# Patient Record
Sex: Male | Born: 1975 | Race: White | Hispanic: No | Marital: Single | Smoking: Current every day smoker
Health system: Southern US, Community
[De-identification: ages and names within clinical notes are randomized; demographics above are authoritative.]

## PROBLEM LIST (undated history)

## (undated) HISTORY — PX: HIP SURGERY: SHX245

---

## 1998-08-10 ENCOUNTER — Ambulatory Visit (HOSPITAL_COMMUNITY): Admission: RE | Admit: 1998-08-10 | Discharge: 1998-08-10 | Payer: Self-pay

## 2003-08-30 ENCOUNTER — Ambulatory Visit (HOSPITAL_COMMUNITY): Admission: RE | Admit: 2003-08-30 | Discharge: 2003-08-30 | Payer: Self-pay | Admitting: Orthopedic Surgery

## 2007-02-25 ENCOUNTER — Inpatient Hospital Stay (HOSPITAL_COMMUNITY): Admission: RE | Admit: 2007-02-25 | Discharge: 2007-02-27 | Payer: Self-pay | Admitting: Orthopaedic Surgery

## 2007-03-21 ENCOUNTER — Inpatient Hospital Stay (HOSPITAL_COMMUNITY): Admission: EM | Admit: 2007-03-21 | Discharge: 2007-03-24 | Payer: Self-pay | Admitting: Emergency Medicine

## 2007-03-21 ENCOUNTER — Ambulatory Visit: Payer: Self-pay | Admitting: Surgery

## 2007-03-28 ENCOUNTER — Ambulatory Visit: Payer: Self-pay | Admitting: Surgery

## 2007-04-08 ENCOUNTER — Encounter: Admission: RE | Admit: 2007-04-08 | Discharge: 2007-04-08 | Payer: Self-pay | Admitting: Surgery

## 2007-04-08 ENCOUNTER — Ambulatory Visit: Payer: Self-pay | Admitting: Surgery

## 2008-11-16 IMAGING — CR DG CHEST 2V
1 series · 1 of 1 positions shown · non-contrast
Comparison: None.

CLINICAL DATA: Hip replacement. 
 CHEST - 2 VIEW:

[w chest lat]
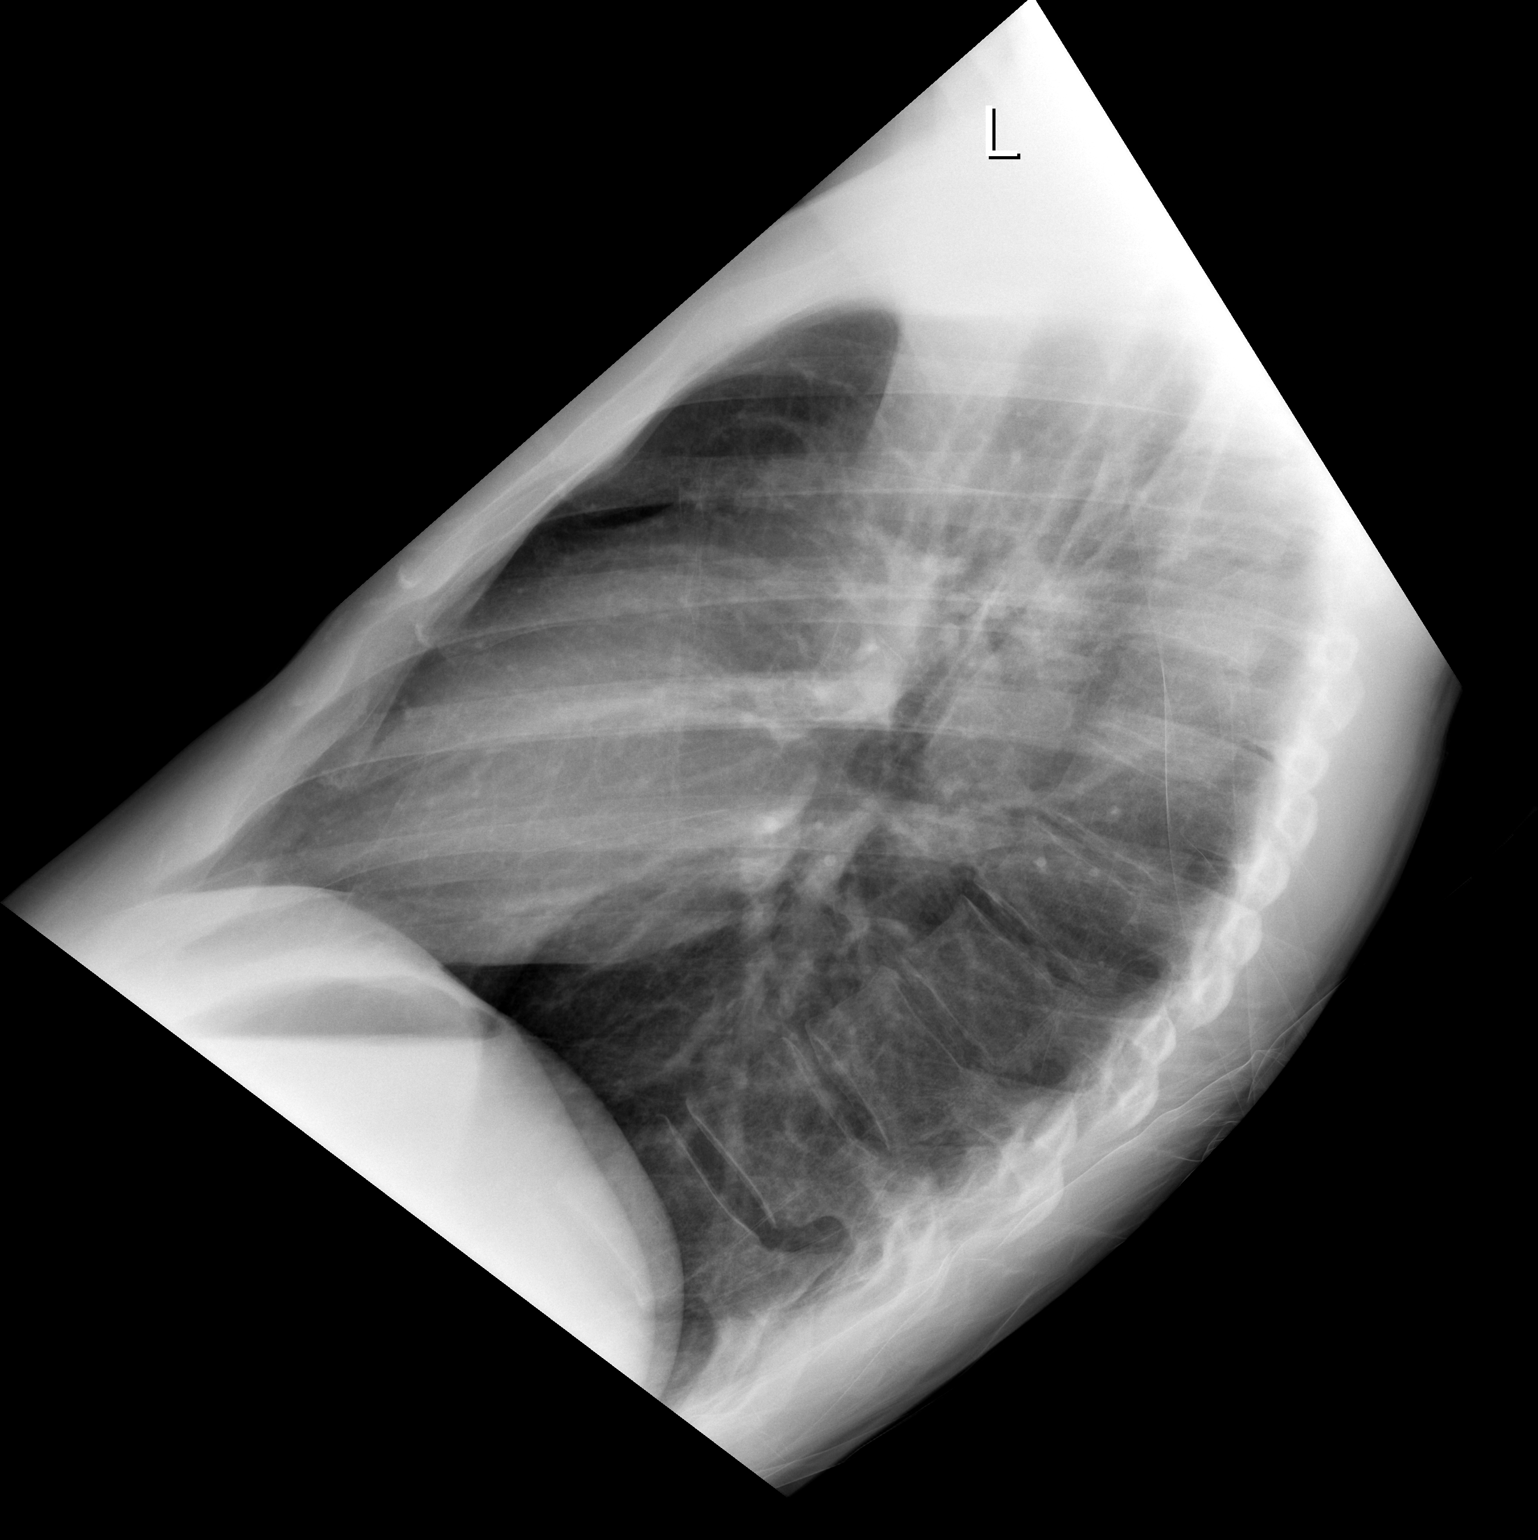

[1 of 1 positions shown; findings below may reference images not displayed]

FINDINGS: Heart and mediastinum are normal.  The lungs are clear.  No effusions.  Bony structures are unremarkable.
IMPRESSION: Normal chest.

## 2008-12-08 IMAGING — CR DG CHEST 2V
2 series · 2 of 2 positions shown · non-contrast
Comparison: 02/26/07.

CLINICAL DATA: 31 year-old-male with chest pain.   History of hip surgery. 
 CHEST - 2 VIEW ? 03/20/07:

[w chest pa]
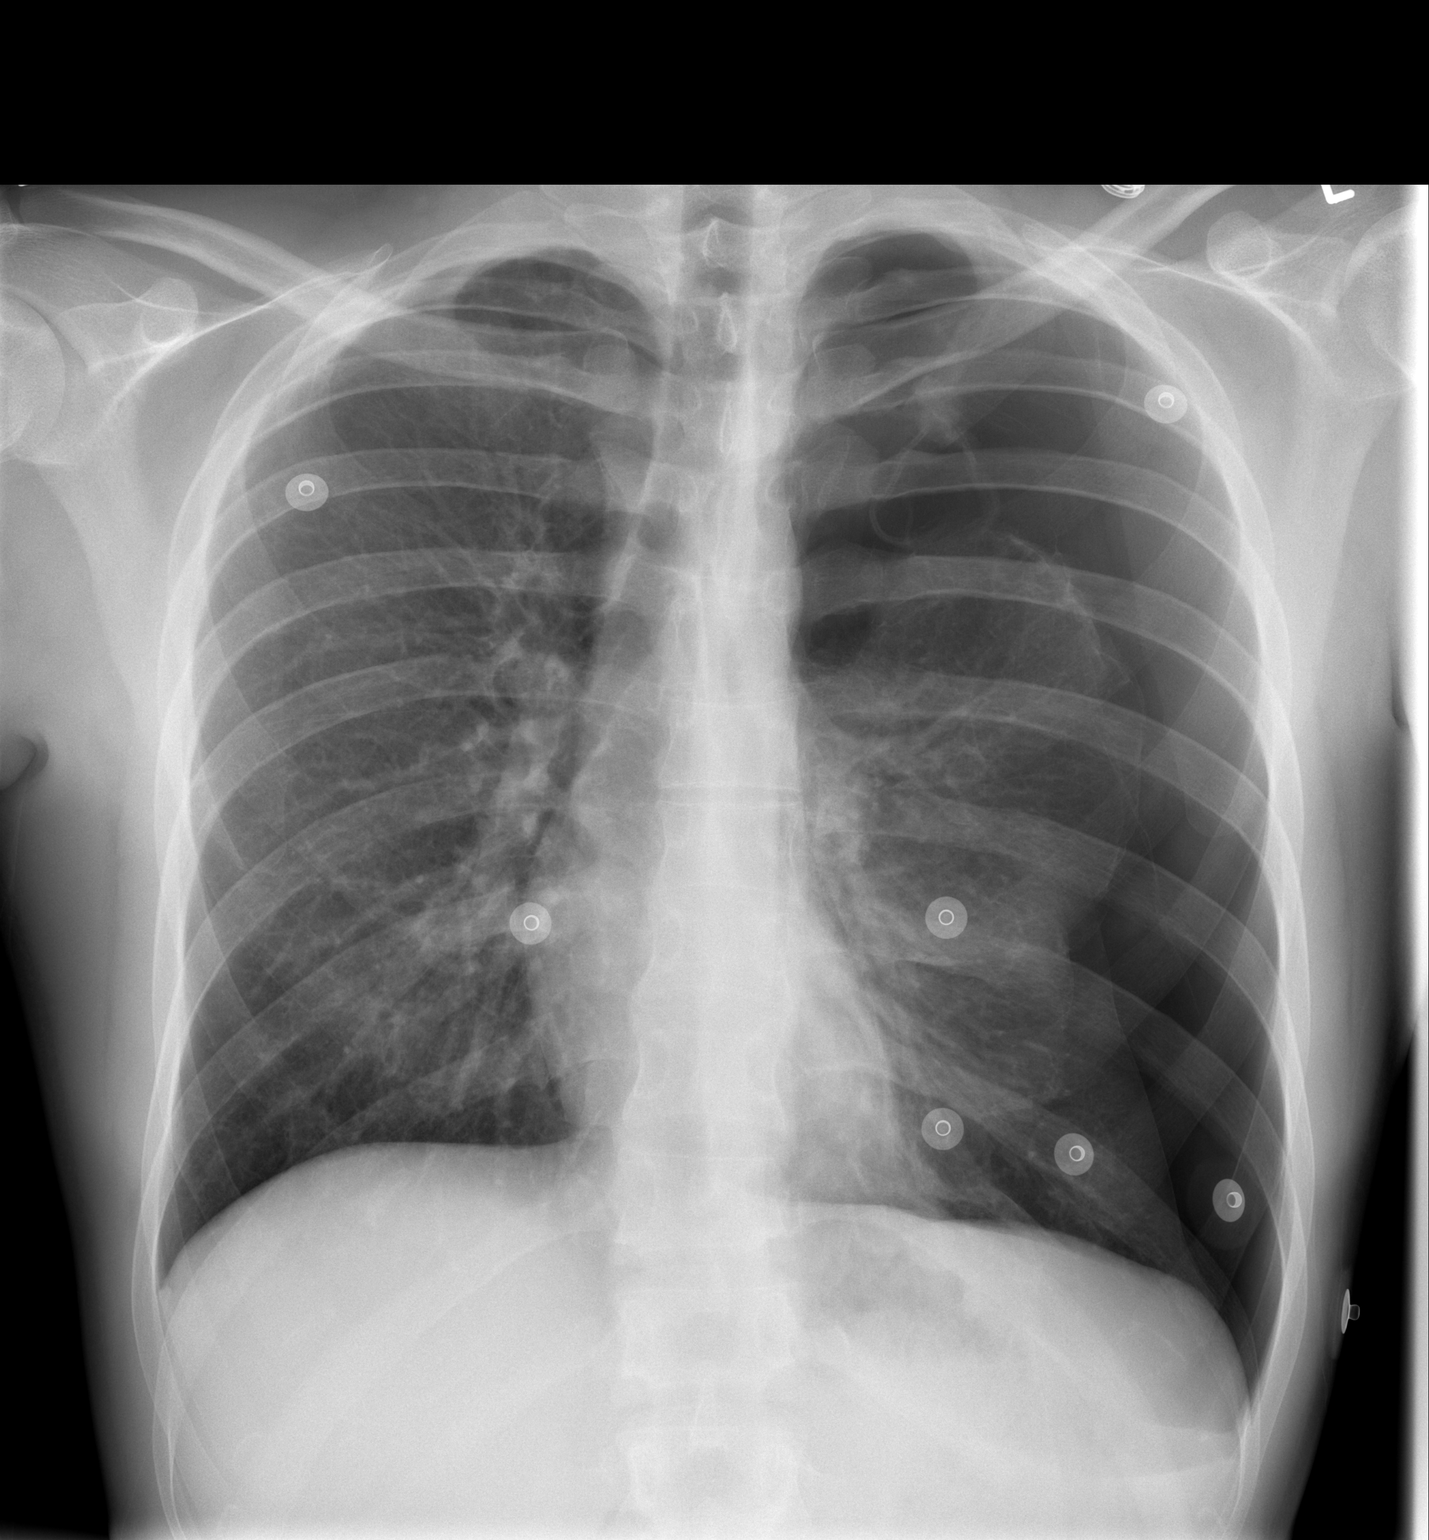

[w chest lat]
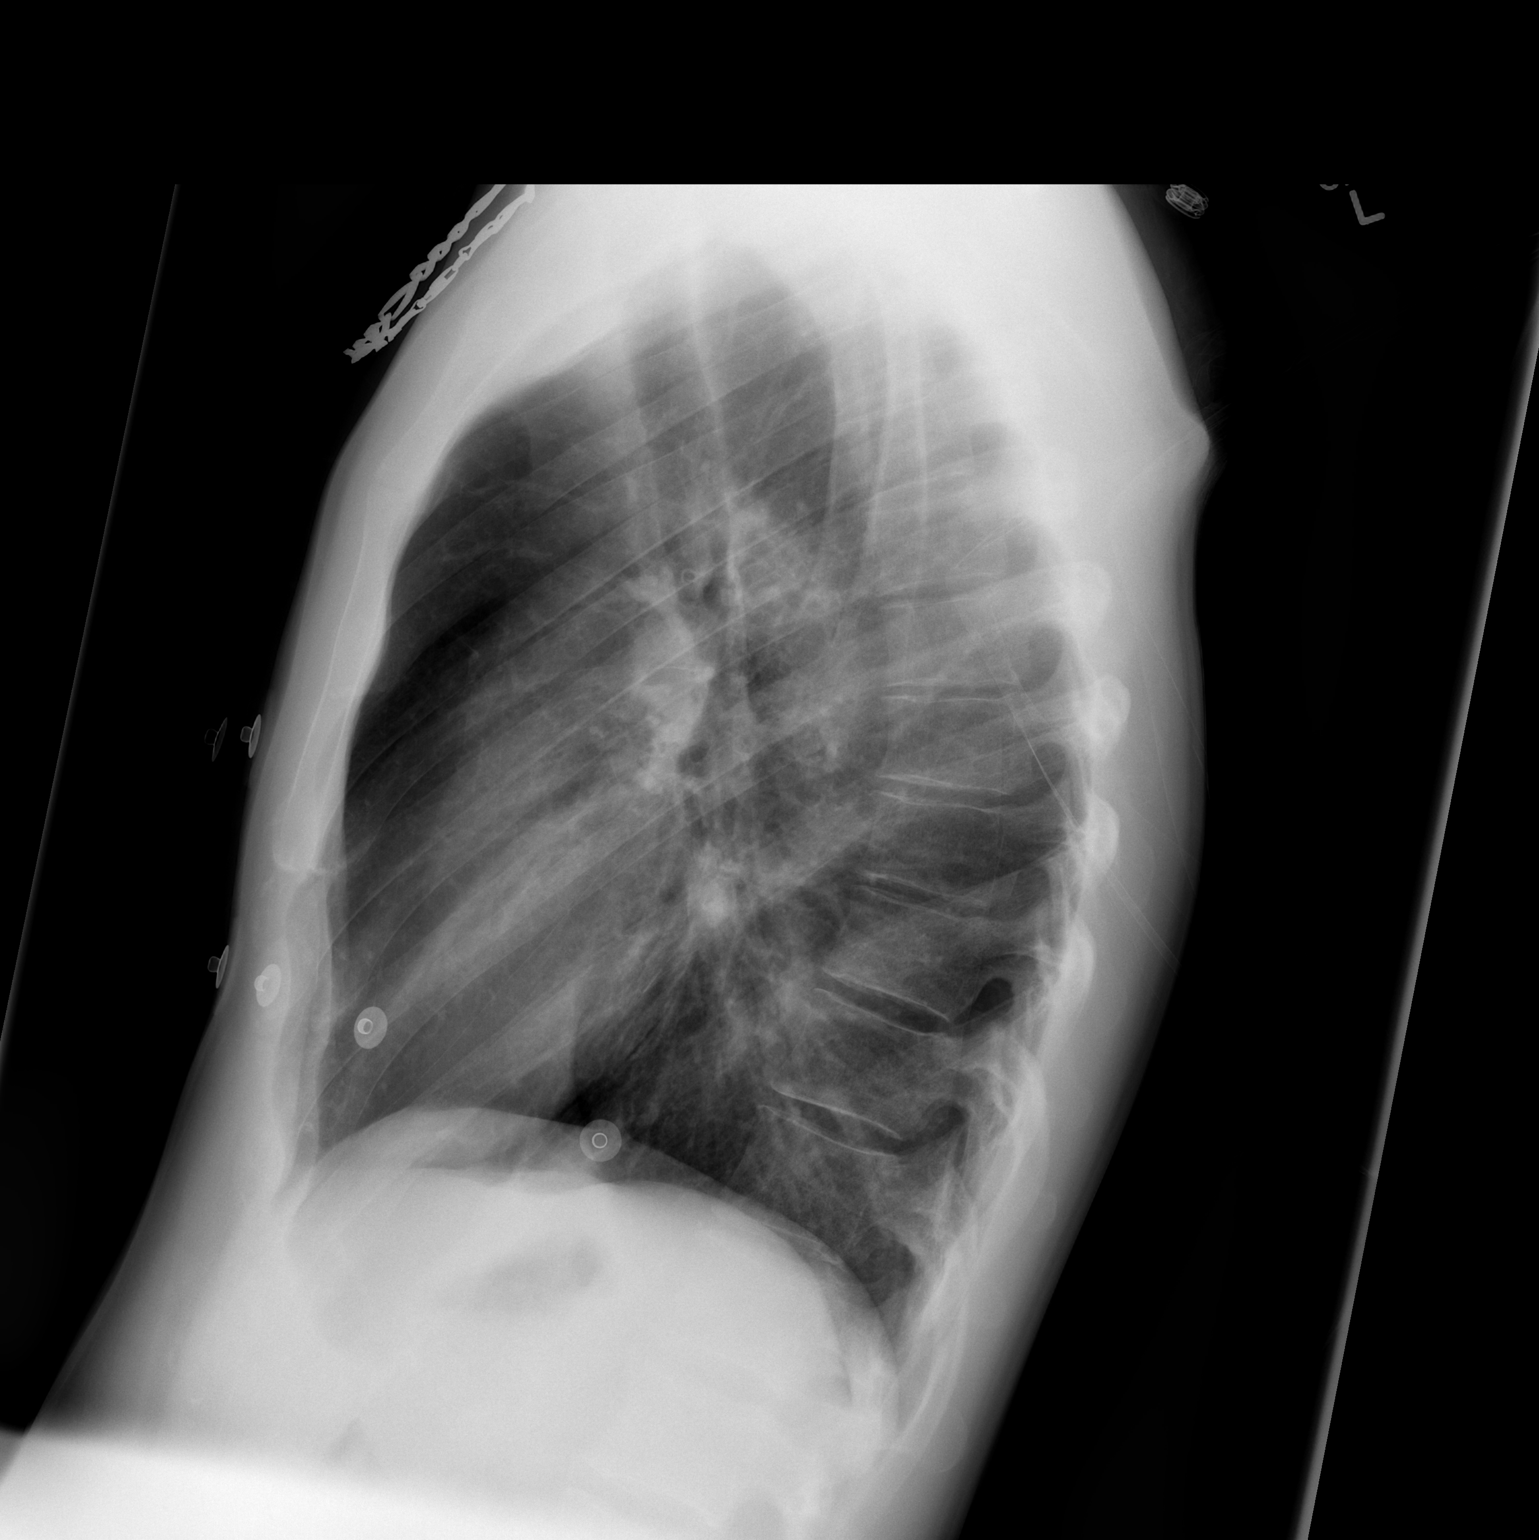

[2 of 2 positions shown; findings below may reference images not displayed]

FINDINGS: Two views of the chest demonstrate a large left pneumothorax measuring at least 50%.  The trachea remains midline without definite mediastinal shift.  The right lung is clear.   Bone structures are intact without a displaced fracture.
IMPRESSION: Large left pneumothorax measuring at least 50%.

## 2008-12-11 IMAGING — CR DG CHEST 2V
2 series · 2 of 2 positions shown · non-contrast
Comparison: Chest radiograph, 03/21/07 and 03/22/07.

CLINICAL DATA: Pneumothorax.  Status post chest tube placement.  Tachycardia.
 CHEST - 2 VIEW - 03/23/07:

[w chest pa]
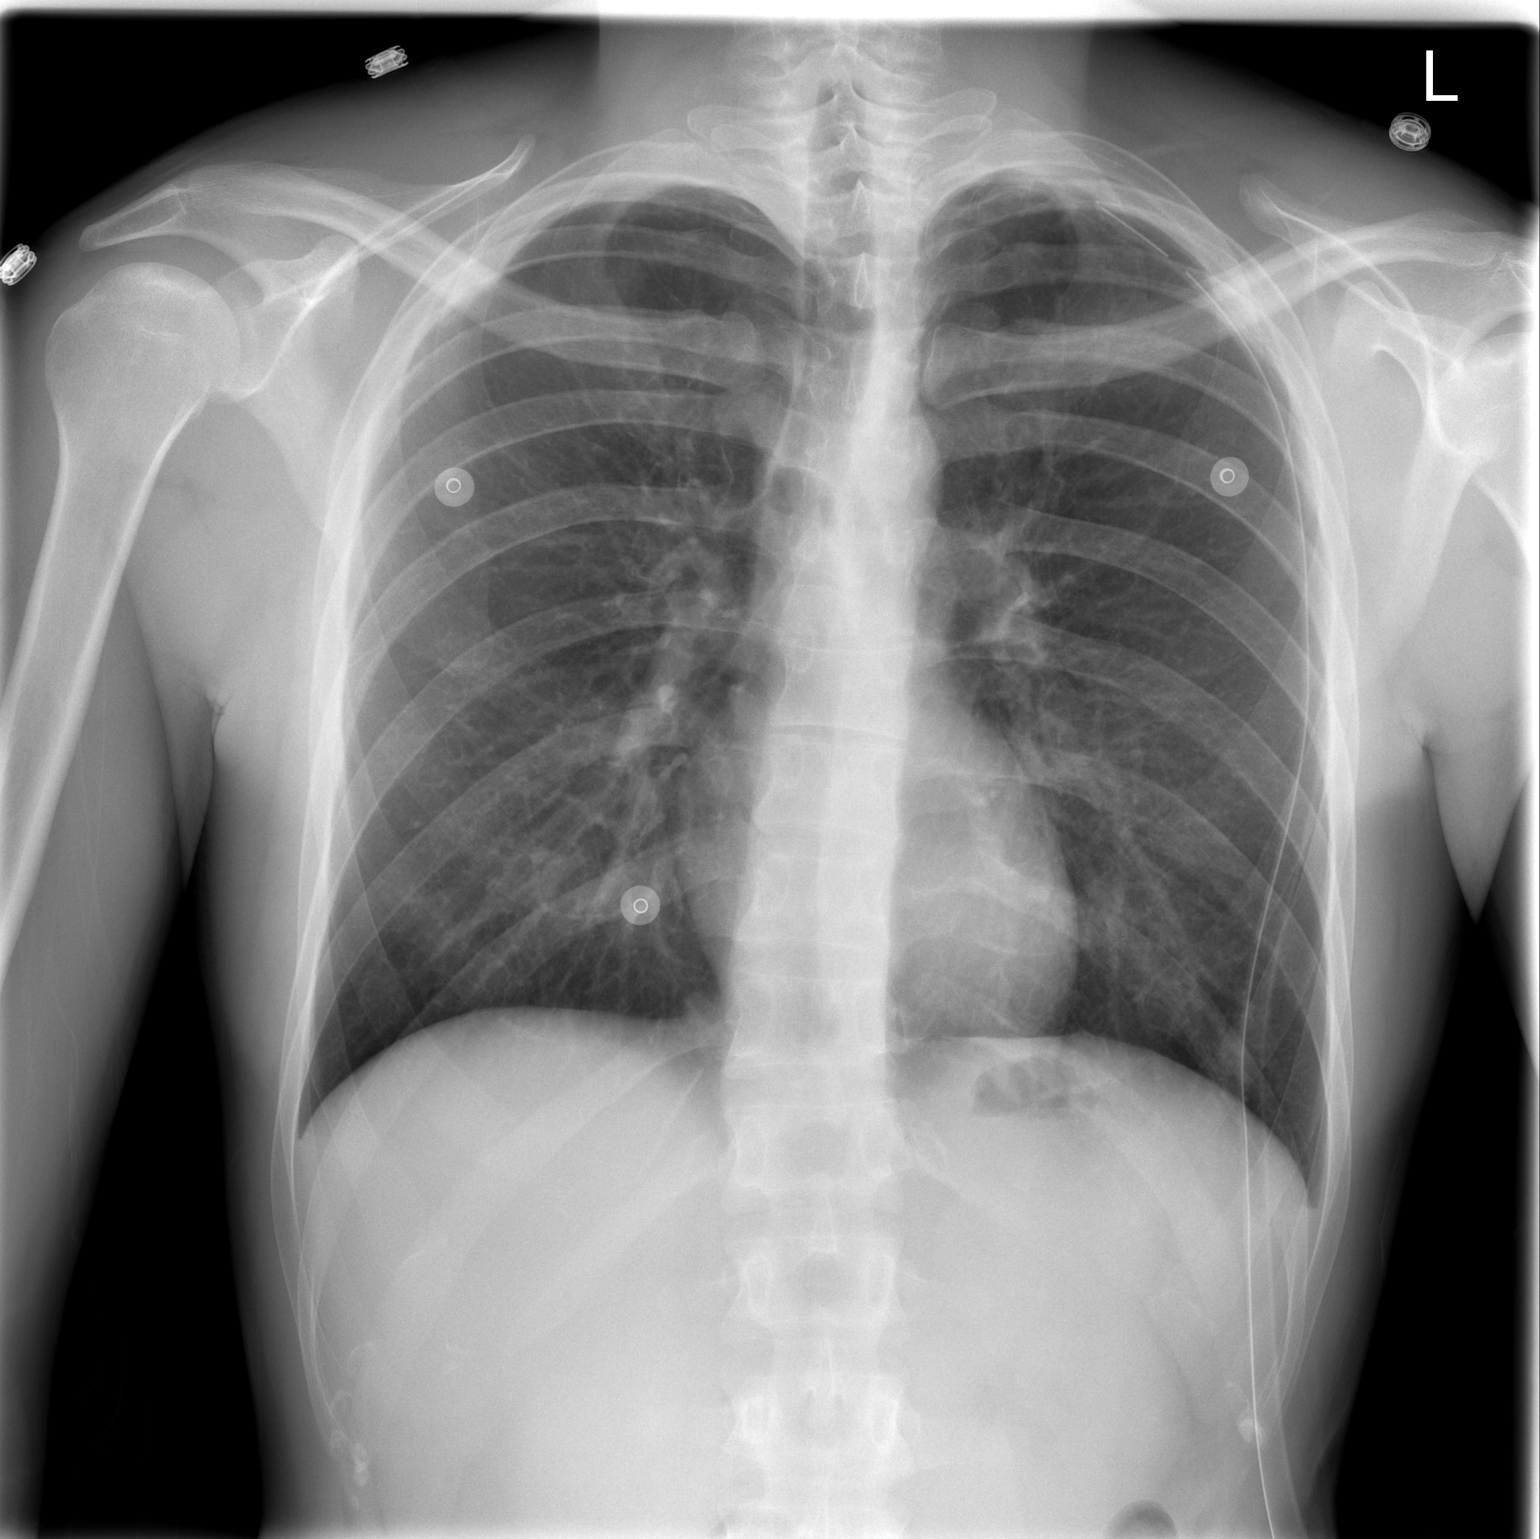

[w chest lat]
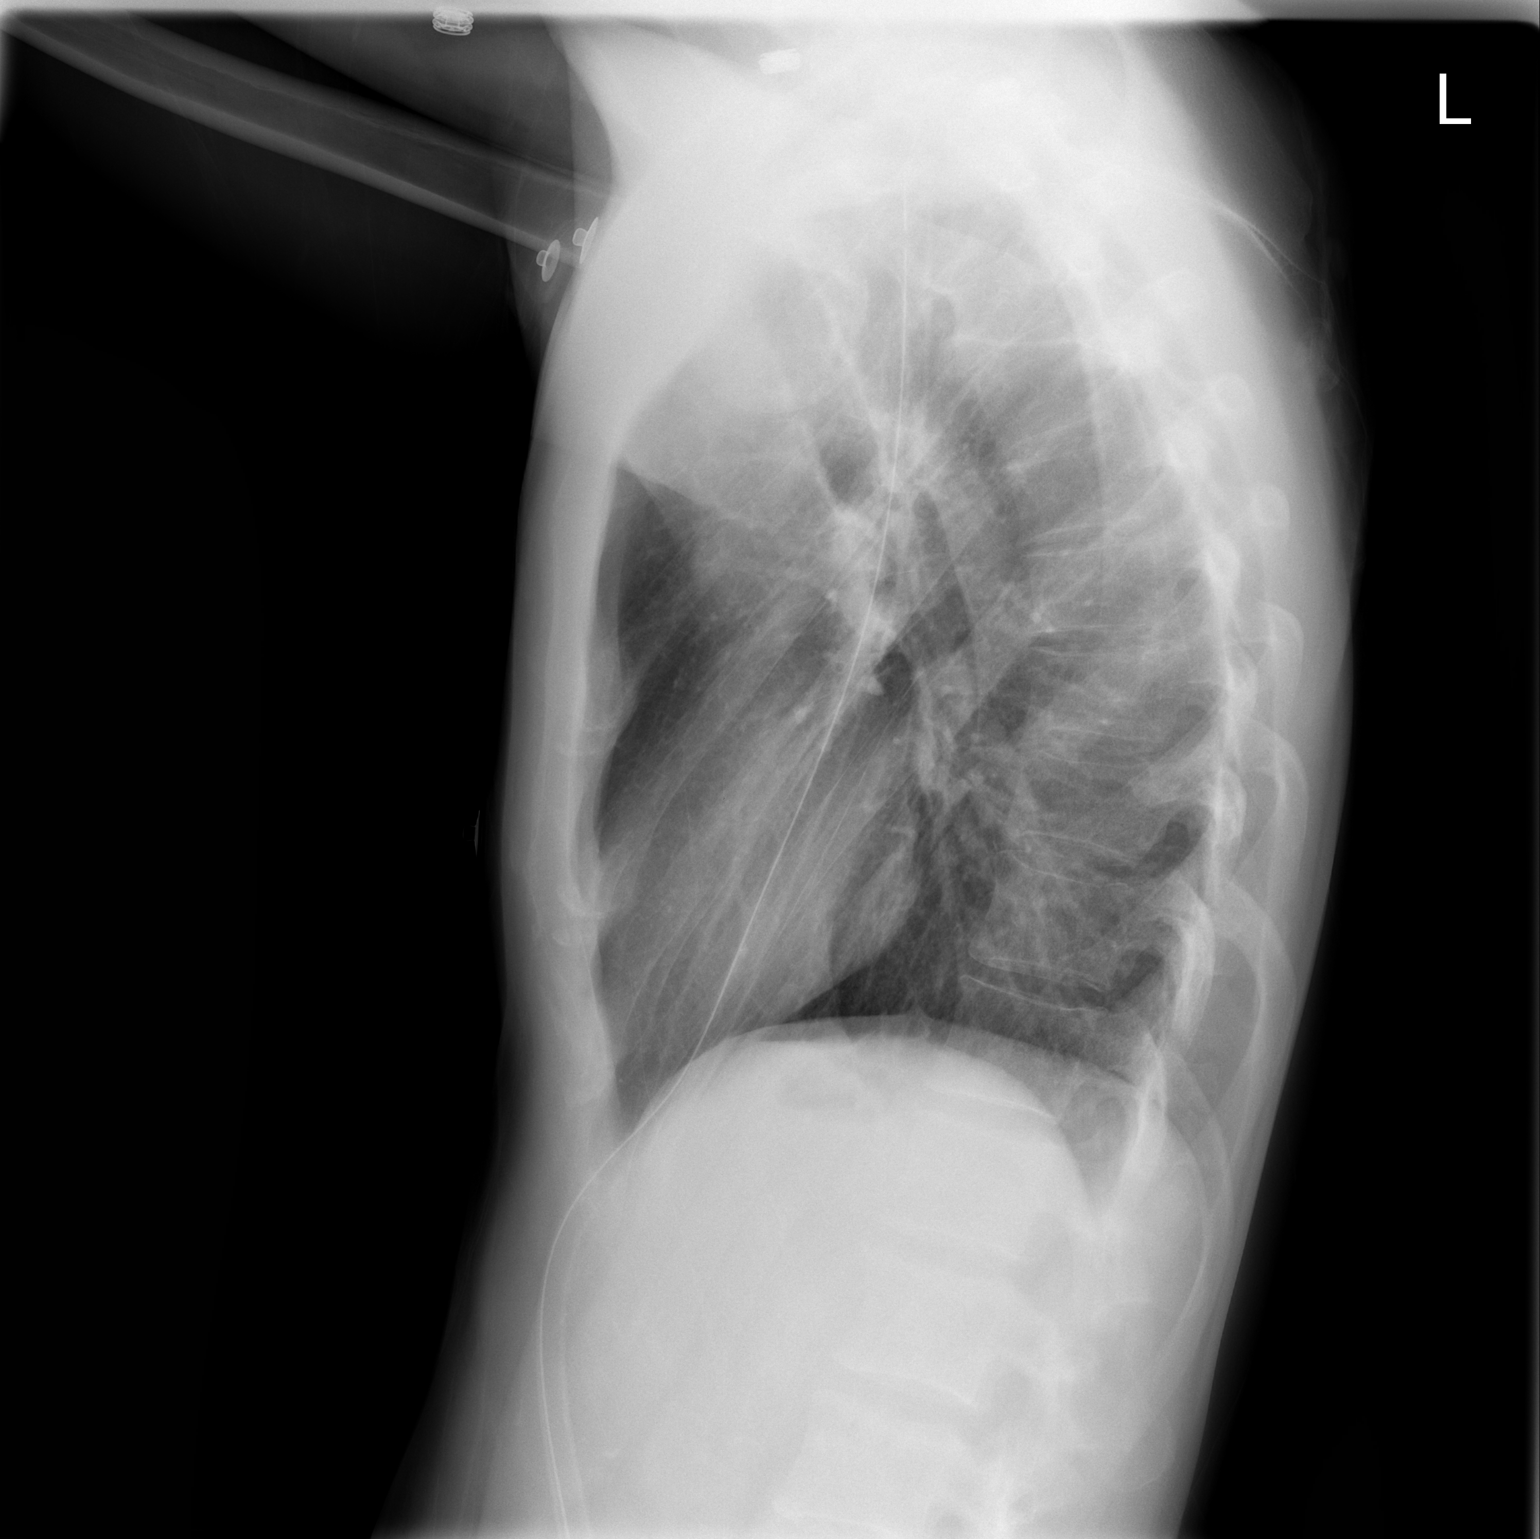

[2 of 2 positions shown; findings below may reference images not displayed]

FINDINGS: A left chest tube is in place with the tip at the left lung apex.  No pneumothorax is identified.  The heart and mediastinal contours are normal.  The lungs are clear.   Negative for effusion.  No acute bony abnormality.
IMPRESSION: 1.  Left chest tube in place. 
 2.  No pneumothorax identified.

## 2010-07-25 NOTE — Assessment & Plan Note (Signed)
OFFICE VISIT   Eastwood, Douglas Short  DOB:  18-Jun-1975                                        April 08, 2007  CHART #:  [id number]   The patient returned today for followup of his spontaneous left  pneumothorax status post chest tube insertion on 03/20/2007.  His air  leak rapidly resolved and the chest tube was removed successfully.  Since discharge, he has been feeling well overall.  He has minimal  discomfort at the chest tube site.   PHYSICAL EXAMINATION:  Today, blood pressure is 133/89.  Pulse 119 and  regular.  Saturation 92% on room air.  He looks well.  Lung exam is  clear.   Followup chest x-ray today shows no pneumothorax or effusion.   IMPRESSION:  The patient is doing well with no sign of recurrent  pneumothorax.  I strongly advised him to completely abstain from  smoking.  I told him he could return to his normal activity as  tolerated.  I told him that there is about 20% chance that this  happening again over the next 2 years and he will contact us if he  develops any chest pain or shortness of breath similar to what he had  before.   Evelene Croon, M.D.  Electronically Signed   BB/MEDQ  D:  04/08/2007  Short:  04/08/2007  Job:  [id number]

## 2010-07-25 NOTE — H&P (Signed)
NAME:  Douglas Short, Douglas Short                ACCOUNT NO.:  [id number]   MEDICAL RECORD NO.:  [id number]          PATIENT TYPE:  INP   LOCATION:  3311                         FACILITY:  [facility]   PHYSICIAN:  Evelene Croon, M.D.     DATE OF BIRTH:  02-Mar-1976   DATE OF ADMISSION:  03/20/2007  DATE OF DISCHARGE:                              HISTORY & PHYSICAL   REASON FOR ADMISSION:  Spontaneous left pneumothorax.   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  He was at home recovering and has been walking with  a walker.  Tonight about 8:30 he had just finished his first walk using  crutches and developed pain under his left arm and then his left chest  associated with shortness of breath.  This pain moved around to  different areas of his left chest and shoulder.  He presented to the  emergency room and a chest x-ray showed greater than 50% left  spontaneous pneumothorax.  The patient remained hemodynamically stable,  developed tachycardia in the 130s, and required 2 liters of oxygen to  maintain sats of 95%.  I was asked to see the patient in the emergency  room.   PAST MEDICAL HISTORY:  Significant for recent right total hip  replacement for avascular necrosis by Dr. Marcene Corning.  He has no  other surgical or medical illnesses.   REVIEW OF SYSTEMS:  GENERAL:  He denies any fever, chills.  She had no  recent weight changes.  He denies fatigue.  EYES: Negative.  ENT:  Negative.  ENDOCRINE:  Denies diabetes and hypothyroidism.  CARDIOVASCULAR:  He denies any previous chest pain except for that  developed this evening.  He denies any previous shortness of breath.  He  has had no PND, orthopnea and denies peripheral edema, palpitations.  RESPIRATORY: Denies cough and sputum production.  GI: Has had no nausea,  vomiting.  Denies any melena or bright red blood per rectum.  GU:  Denies  dysuria and hematuria.  MUSCULOSKELETAL:  Denies arthralgias and  myalgias.  He is status post right total hip replacement recently.  VASCULAR:  Denies claudication and phlebitis.  He has been taking  Coumadin for DVT prevention after surgery.  A Home Health nurse has been  checking INR couple times a week.  NEUROLOGICAL:  Denies any focal  weakness or numbness.  Denies dizziness and syncope.  No TIA or stroke.   ALLERGIES:  None.   SOCIAL HISTORY:  He smokes one to one and a half packs of cigarettes per  day.  He drinks some alcohol.  He does smoke marijuana.  He works in a  Systems analyst.  He is single and here with his parents.   FAMILY HISTORY:  Negative.   PHYSICAL EXAMINATION:  VITAL SIGNS:  Blood pressure is 140/87, pulse 136  and regular in sinus tachycardia.  Respiratory rate is 24 and unlabored.  He is afebrile.  Sat is 95% on 2 liters.  He is  a thin, well-developed  white male in no distress.  HEENT:  Normocephalic and atraumatic.  Pupils are equal, reactive to  light and accommodation.  Extraocular muscles are intact.  Throat is  clear.  NECK:  Normal carotid pulses bilaterally.  There are no bruits.  There  is no adenopathy or thyromegaly.  CARDIAC:  Regular rate and rhythm with normal S1-S2.  There is no  murmur, rub or gallop.  LUNGS:  Revealed decreased breath sounds on the left.  ABDOMEN:  Active bowel sounds.  Abdomen is soft and nontender.  No  palpable masses or organomegaly.  EXTREMITY:  No peripheral edema.  Pedal pulses bilaterally.  Skin is  warm and dry.  NEUROLOGIC:  Alert and oriented x3.  Motor and sensory exams grossly  normal.   IMPRESSION:  Mr. Kall has a first episode of spontaneous left  pneumothorax.  We will plan to insert a chest tube.  I discussed the  pathologic process with he and his parents.  We discussed alternatives  for treatment including chest tube and surgery.  I do not think he is a  candidate for observation giving the degree  of pneumothorax.  I  discussed the benefits and risk of the chest tube insertion including  but not limited to bleeding, infection, failure to resolve the  spontaneous pneumothorax, persistent air leak requiring surgery, and  recurrence of pneumothorax even if it is successfully treated initially.  He understands all this and agrees to proceed.      Evelene Croon, M.D.  Electronically Signed     BB/MEDQ  D:  03/21/2007  T:  03/21/2007  Job:  [id number]

## 2010-07-25 NOTE — Op Note (Signed)
NAME:  Douglas Short, Douglas Short                ACCOUNT NO.:  [id number]   MEDICAL RECORD NO.:  [id number]          PATIENT TYPE:  INP   LOCATION:  3311                         FACILITY:  [facility]   PHYSICIAN:  Evelene Croon, M.D.     DATE OF BIRTH:  [birth year]   DATE OF PROCEDURE:  03/21/2007  DATE OF DISCHARGE:                               OPERATIVE REPORT   PREOPERATIVE AND POSTOPERATIVE DIAGNOSIS:  Large spontaneous left  pneumothorax.   PROCEDURE:  Insertion of left tube.   SURGEON:  Dr. Evelene Croon.   ANESTHESIA:  1% lidocaine local anesthesia.   CLINICAL HISTORY:  The patient is a 35 year old smoker who presented  with his first episode of spontaneous left pneumothorax.  The chest x-  ray showed a greater than 50% left spontaneous pneumothorax.  He is  tachycardic and short of breath.  I discussed the options for treatment  including proceeding to thoracoscopic surgery as well as chest tube  insertion which is the treatment I recommended.  I discussed the  alternatives, benefits, and risks including bleeding, injury to the  lung, infection, prolonged air leak requiring eventual surgery,  inability to re-expand the lung with the chest tube, and recurrence of  pneumothorax even though initially treated successfully.  He understood  all this and agreed to proceed.   PROCEDURE:  This was performed in the emergency department on a  stretcher.  After informed consent was obtained and the patient was  given 2 mg of intravenous Versed, then the left chest was prepped with  Betadine solution and draped in usual sterile manner.  The skin and  subcutaneous tissue in the left anterior axillary line was anesthetized  with 1% lidocaine local anesthesia.  A small incision was made and  carried down through the subcutaneous tissues using blunt dissection.  The left pleural space was entered bluntly.  There is a large rush of  air.  Then a 20-French trocar chest tube was inserted and advanced so  the  tip of the tube was near the apex of the chest.  The trocar was  removed.  The chest tube was sutured to the skin with a 2-0 silk suture  and connected to Pleur-Evac suction.  Dry sterile dressing applied  around the tube.  There is complete hemostasis.  The patient tolerated  procedure well.  Follow-up chest x-ray showed complete re-expansion of  the left lung with tube in good position.      Evelene Croon, M.D.  Electronically Signed     BB/MEDQ  D:  03/21/2007  T:  03/21/2007  Job:  [id number]

## 2010-07-25 NOTE — Discharge Summary (Signed)
NAME:  Douglas Short, Douglas Short                ACCOUNT NO.:  [id number]   MEDICAL RECORD NO.:  [id number]          PATIENT TYPE:  INP   LOCATION:  2030                         FACILITY:  [facility]   PHYSICIAN:  Evelene Croon, M.D.     DATE OF BIRTH:  02/18/1976   DATE OF ADMISSION:  03/20/2007  DATE OF DISCHARGE:                               DISCHARGE SUMMARY   FINAL DIAGNOSIS:  Spontaneous left pneumothorax.   SECONDARY DIAGNOSES:  Status post right total  hip replacement for  septic necrosis by Dr. [name].   IN HOSPITAL OPERATIONS AND PROCEDURES:  Placement of a left 20-French  chest tube.   HISTORY AND PHYSICAL AND HOSPITAL COURSE:  The patient is a 35 year old  white male smoker status post recent right THR for septic necrosis by  Dr. [name] who develop sudden pain in his left chest on March 21, 2007 after walking around with crutches for the first time since  surgery.  His pain started under the left shoulder and moved around with  associated shortness of breath.  The patient came into the ER at that  point.  Chest x-ray done showed greater than 50% left spontaneous  pneumothorax.  Dr. Laneta Simmers was contacted at this time.  Dr. Laneta Simmers  discussed with the patient placement of a left chest tube for  spontaneous left pneumothorax.  He discussed risks and benefits with the  patient.  The patient acknowledged understanding and agreed to proceed.  A 20 French left chest tube was placed while the patient was in the  emergency department.   PAST MEDICAL HISTORY/PHYSICAL EXAMINATION:  For details, please see  dictated H&P.   The patient was admitted to [facility] on March 20, 2007 with  diagnosis of spontaneous left pneumothorax status post placement of left  chest tube.  Chest x-ray obtained post placement of chest tube showed  resolved pneumothorax.  The patient was placed to suction.  During the  patient's postoperative course, no air leak was noted from the patient's  Pleur-evac.   Chest x-ray remained stable with no pneumothorax.  The  patient's chest tube was placed to waterseal on March 22, 2007.  Chest  x-ray remained stable with no pneumothorax, and chest tube was DC.  Follow-up chest x-ray remained stable with no pneumothorax.  During this  time, the patient's vital signs were monitored.  The patient remained  afebrile.  He was able to be weaned off oxygen, satting greater than 90%  on room air.  He remained in normal sinus rhythm.  Pulmonary status was  stable and continued to use his incentive spirometer.  The chest tube  site was clean, dry and intact post removal of chest tube.  The patient  was restarted on his Coumadin.  Daily PT INRs were obtained.  Last INR  obtained March 23, 2007 was 1.9.  The patient was out of bed  ambulating well post admission.  He is tolerating diet well.  No nausea  or vomiting noted.   The patient is tentatively ready for discharge home in the a.m. pending  his PA and  lateral chest x-ray remain stable.   The patient will need to follow up with Dr. Laneta Simmers in 3 weeks.  Our  office will contact the patient with this information.  The patient will  need to obtain PA and lateral chest x-ray 30 minutes prior to this  appointment.  The patient will need to follow up with Dr. [name] as  directed.  He will need obtain PT/INR per Dr. [name] as directed.   ACTIVITY:  The patient instructed no driving to released to do so, no  heavy lifting over 10 pounds.  He is told to ambulate 3-4 times per day  and progress as tolerated.  Continue his breathing exercises.   INCISIONAL CARE:  The patient is told __________ shower from __________  .  He is to wash his incisions using soap and water.  He is to contact  the office if he develops any drainage or opening from any of his  incision sites.   DIET:  The patient's diet is to be low fat, low salt.  Patient  instructed to stop smoking.   DISCHARGE MEDICATIONS:  1. Coumadin will be  dosed based on the patient's discharge PT/INR      level.  2. Percocet 5/325, 1-2 tablets q.4-6h. p.r.n. pain.      Theda Belfast, [place]      Evelene Croon, M.D.  Electronically Signed    KMD/MEDQ  D:  03/23/2007  T:  03/23/2007  Job:  [id number]   cc:   Lubertha Basque. [name], M.D.

## 2010-07-25 NOTE — Op Note (Signed)
NAME:  Douglas Short, Douglas Short                ACCOUNT NO.:  [id number]   MEDICAL RECORD NO.:  [id number]          PATIENT TYPE:  INP   LOCATION:  2899                         FACILITY:  [facility]   PHYSICIAN:  Lubertha Basque. Dalldorf, M.D.DATE OF BIRTH:  1975-09-02   DATE OF PROCEDURE:  02/25/2007  DATE OF DISCHARGE:                               OPERATIVE REPORT   PREOPERATIVE DIAGNOSIS:  Right hip avascular necrosis.   POSTOPERATIVE DIAGNOSIS:  Right hip avascular necrosis.   PROCEDURE:  Right total hip replacement.   ANESTHESIA:  General.   ATTENDING SURGEON:  Lubertha Basque. [name], M.D.   ASSISTANT:  [name], P.A.   INDICATIONS FOR PROCEDURE:  The patient is a 35 year old man whom we  have been following for years for avascular necrosis of the hips.  He  has been managed with oral antiinflammatories and activity restriction,  but has experienced progressive collapse of the femoral heads.  On the  right, he has severe pain at this point which makes it difficult for him  to rest or stand or walk.  He is offered a hip replacement operation.  Informed operative consent was obtained after discussion of possible  complications of reaction to anesthesia, infection, DVT, PE,  dislocation, and death.   SUMMARY OF FINDINGS AND PROCEDURE:  Under general anesthesia and a  posterior approach, a right total hip replacement was performed.  He had  significant collapse of the femoral head, and the specimen was  consistent with avascular necrosis.  He had excellent bone quality.  We  addressed this problem with an uncemented DePuy ASR system using a 6  standard Summit femoral stem capped with a +5, 46 ASR head, fitting into  a size 52 ASR acetabulum.  Bryna Colander assisted throughout and was  invaluable to the completion of the case in that he helped position and  retract while I performed the procedure.   DESCRIPTION OF THE PROCEDURE:  The patient was taken to the operating  suite, where a general  anesthetic was applied without difficulty.  He  was positioned in the lateral decubitus position and prepped and draped  in a normal sterile fashion.  After administration of preoperative IV  Kefzol. a posterior approach was taken to the right hip.  All  appropriate antiinfective measures were used, including closed-hooded  exhaust system for each member of the surgical team, Betadine-  impregnated drape, and the preoperative IV Kefzol.  An incision was made  with dissection down through a paucity of adipose tissue to the IT band.  The IT band was then incised longitudinally up through the gluteus  maximus fascia.  The short external rotators of the hip were tagged and  reflected, and a posterior capsulectomy was performed.  The hip was  dislocated.  I made a femoral neck cut just above the lesser trochanter.  The femoral head was examined on the back table and was found to have  collapsed with soft areas consistent with avascular necrosis.  The  acetabulum was fully exposed and reamed toward the medial wall with the  small reamers.  We then sequentially  reamed up to 51, followed by  placement of a size 52 ASR cup, in slightly greater tilt and anteversion  than normal to accommodate his lifestyle, which will involve racing in a  significantly flexed position.  We then turned our attention towards the  femur.  This was reamed and broached appropriately to a size 6.  A trial  reduction was done with this component and various head and neck  assemblies, and the standard +5 seemed to give him excellent stability  in flexion witn internal rotation as well as extension with external  rotation.  Leg lengths were judged to be roughly equal.  The trial  component was removed, followed by placement of the size 6 standard  Summit stem in slight anteversion.  This was capped with the +5, 46 ASR  head.  The hip was again reduced and was again stable.  The wound was  irrigated, followed by  reapproximation of the short external rotators to  the greater trochanteric region with nonabsorbable suture.  IT band and  gluteus maximus fascia were reapproximated with #1 Vicryl in an  interrupted fashion.  Subcutaneous tissues were reapproximated with 0  and 2-0 undyed Vicryl, and skin was closed with staples.  Adaptic was  placed in the wound, followed by dry gauze and tape.  Estimated blood  loss was 100 mL. and intraoperative fluids can be obtained from  anesthesia records.   DISPOSITION:  The patient was extubated in the operating room and taken  to the recovery in stable addition.  He was to be admitted to the  orthopedic surgery service for appropriate postop care to include  perioperative antibiotics and Coumadin plus Lovenox for DVT prophylaxis.      Lubertha Basque [name], M.D.  Electronically Signed     PGD/MEDQ  D:  02/25/2007  T:  02/25/2007  Job:  [id number]

## 2010-07-25 NOTE — Discharge Summary (Signed)
NAME:  Douglas Short, Douglas Short                ACCOUNT NO.:  [id number]   MEDICAL RECORD NO.:  [id number]          PATIENT TYPE:  INP   LOCATION:  5041                         FACILITY:  [facility]   PHYSICIAN:  Lubertha Basque. Dalldorf, M.D.DATE OF BIRTH:  [birth year]   DATE OF ADMISSION:  02/25/2007  DATE OF DISCHARGE:  02/27/2007                               DISCHARGE SUMMARY   ADMITTING DIAGNOSES:  1. Avascular necrosis, right hip  2. Avascular necrosis, left hip.   DISCHARGE DIAGNOSES:  1. Avascular necrosis, right hip  2. Avascular necrosis, left hip.  3. Leukocytosis of undetermined etiology.   OPERATIONS:  Right total hip replacement.   BRIEF HISTORY:  [name] is a 35 year old white male patient well-known  to our practice with severe pain in his right hip when he ambulates,  even sleeping at night time now, localizes in the groin.  We have done  an MRI scan which has end-stage AVN.  Discussed with him treatment  possibilities, that being a total hip replacement.   PERTINENT LABORATORY AND X-RAY FINDINGS:  His WBCs initially were 14.3,  spiked at 20,000 then came down to 19.6.  We had sent away urine for  urinalysis and C&S, which was still pending at the time of this  dictation.  Hemoglobin 18, platelets 234, sodium 140, potassium 4.0,  other indices were normal.   COURSE IN THE HOSPITAL:  Was admitted postoperatively placed on a  variety of p.o and IM analgesics for pain, IV Ancef a gram q. hours x3  doses, pharmacy for low-dose Coumadin and Lovenox protocol for DVT  prophylaxis.  He was kept on other oral pain medicines, iron, and  physical therapy was ordered to be touchdown weightbearing, as this  touchdown weightbearing.  He had the help of physical therapy for  stairs, did well, and was cleared to go home on the second day post-op.  His dressing was changed wound was noted to be benign.  There were no  signs of infection.  He was feeling fine.  His lungs were clear, his  abdomen was  soft, he was urinating and voiding on his own, no  difficulties, had not yet had a bowel movement.  Had good pulses  distally without sign of skin irritation or infection around the  incision.   CONDITION ON DISCHARGE:  Improved.   DISCHARGE INSTRUCTIONS:  Follow up and return to our office in 10 days  and be on Percocet one every 4 to 6 for pain as needed, prescription  given, and Coumadin dose regulated by pharmacy.  We have likely as  stated above  drawn a urine for C&S and urinalysis pending, which we will check the  results or office on the computer and call him, if there is anything  abnormal.  Low-sodium, heart-healthy diet, crutches or walker, touchdown  weightbearing.  Any his signs of infections, to call our office at [phone] and let us know.      [name], P.A.      Lubertha Basque [name], M.D.  Electronically Signed    MC/MEDQ  D:  02/27/2007  T:  02/28/2007  Job:  [id number]

## 2010-11-30 LAB — DIFFERENTIAL
Basophils Absolute: 0.1
Eosinophils Relative: 2
Lymphocytes Relative: 23
Neutro Abs: 11.2 — ABNORMAL HIGH

## 2010-11-30 LAB — POCT I-STAT CREATININE: Operator id: 282201

## 2010-11-30 LAB — I-STAT 8, (EC8 V) (CONVERTED LAB)
Bicarbonate: 30.8 — ABNORMAL HIGH
Glucose, Bld: 98
Hemoglobin: 17.7 — ABNORMAL HIGH
Sodium: 138
TCO2: 32
pCO2, Ven: 45.9
pH, Ven: 7.435 — ABNORMAL HIGH

## 2010-11-30 LAB — CBC
HCT: 48.4
Platelets: 473 — ABNORMAL HIGH
RDW: 14

## 2010-11-30 LAB — PROTIME-INR
INR: 1.9 — ABNORMAL HIGH
Prothrombin Time: 22.2 — ABNORMAL HIGH
Prothrombin Time: 22.8 — ABNORMAL HIGH

## 2010-11-30 LAB — POCT CARDIAC MARKERS
CKMB, poc: 1
Troponin i, poc: <0.05

## 2010-12-15 LAB — URINALYSIS, ROUTINE W REFLEX MICROSCOPIC
Glucose, UA: NEGATIVE
Leukocytes, UA: NEGATIVE
Protein, ur: NEGATIVE
Specific Gravity, Urine: 1.018
pH: 7

## 2010-12-15 LAB — BASIC METABOLIC PANEL
BUN: 2 — ABNORMAL LOW
BUN: 5 — ABNORMAL LOW
CO2: 29
CO2: 31
Calcium: 10
Chloride: 99
Creatinine, Ser: 0.83
Creatinine, Ser: 0.84
Creatinine, Ser: 0.92
GFR calc non Af Amer: >60
Glucose, Bld: 108 — ABNORMAL HIGH
Glucose, Bld: 110 — ABNORMAL HIGH
Potassium: 3.6

## 2010-12-15 LAB — CBC
HCT: 44.4
HCT: 45.2
HCT: 53.6 — ABNORMAL HIGH
Hemoglobin: 15.3
MCHC: 34
MCHC: 34.5
MCV: 98.2
MCV: 98.4
Platelets: 193
Platelets: 217
Platelets: 234
RBC: 4.51
RDW: 14.6
WBC: 19.6 — ABNORMAL HIGH

## 2010-12-15 LAB — PROTIME-INR
INR: 0.9
Prothrombin Time: 12.9

## 2010-12-15 LAB — URINE CULTURE: Culture: NO GROWTH

## 2010-12-15 LAB — URINE MICROSCOPIC-ADD ON

## 2012-10-16 ENCOUNTER — Ambulatory Visit: Payer: Self-pay | Admitting: Nurse Practitioner

## 2012-10-16 ENCOUNTER — Encounter: Payer: Self-pay | Admitting: Nurse Practitioner

## 2012-10-16 VITALS — BP 132/86 | HR 93 | Temp 97.4°F | Ht 70.0 in | Wt 154.0 lb

## 2012-10-16 DIAGNOSIS — Z0289 Encounter for other administrative examinations: Secondary | ICD-10-CM

## 2012-10-16 NOTE — Patient Instructions (Signed)
Alcohol Intoxication  You have alcohol intoxication when the amount of alcohol that you have consumed has impaired your ability to mentally and physically function. There are a variety of factors that contribute to the level at which alcohol intoxication can occur, such as age, gender, weight, frequency of alcohol consumption, medication use, and the presence of other medical conditions, such as diabetes, seizures, or heart conditions.  The blood alcohol level test measures the concentration of alcohol in your blood. In most states, your blood alcohol level must be lower than 80 mg/dL (0.08%) to legally drive. However, many dangerous effects of alcohol can occur at much lower levels.  Alcohol directly impairs the normal chemical activity of the brain and is said to be a chemical depressant. Alcohol can cause drowsiness, stupor, respiratory failure, and coma. Other physical effects can include headache, vomiting, vomiting of blood, abdominal pain, a fast heartbeat, difficulty breathing, anxiety, and amnesia. Alcohol intoxication can also lead to dangerous and life-threatening activities, such as fighting, dangerous operation of vehicles or heavy machinery, and risky sexual behavior.  Alcohol can be especially dangerous when taken with other drugs. Some of these drugs are:   Sedatives.   Painkillers.   Marijuana.   Tranquilizers.   Antihistamines.   Muscle relaxants.   Seizure medicine.  Many of the effects of acute alcohol intoxication are temporary. However, repeated alcohol intoxication can lead to severe medical illnesses. If you have alcohol intoxication, you should:   Stay hydrated. Drink enough water and fluids to keep your urine clear or pale yellow. Avoid excessive caffeine because this can further lead to dehydration.   Eat a healthy diet. You may have residual nausea, headache, and loss of appetite, but it is still important that you maintain good nutrition. You can start with clear  liquids.   Take nonsteroidal anti-inflammatory medications as needed for headaches, but make sure to do so with small meals. You should avoid acetaminophen for several days after having alcohol intoxication because the combination of alcohol and acetaminophen can be toxic to your liver.  If you have frequent alcohol intoxication, ask your friends and family if they think you have a drinking problem. For further help, contact:   Your caregiver.   Alcoholics Anonymous (AA).   A drug or alcohol rehabilitation program.  SEEK MEDICAL CARE IF:    You have persistent vomiting.   You have persistent pain in any part of your body.   You do not feel better after a few days.  SEEK IMMEDIATE MEDICAL CARE IF:    You become shaky or tremble when you try to stop drinking.   You shake uncontrollably (seizure).   You throw up (vomit) blood. This may be bright red or it may look like black coffee grounds.   You have blood in the stool. This may be bright red or appear as a black, tarry, bad smelling stool.   You become lightheaded or faint.  ANY OF THESE SYMPTOMS MAY REPRESENT A SERIOUS PROBLEM THAT IS AN EMERGENCY. Do not wait to see if the symptoms will go away. Get medical help right away. Call your local emergency services (911 in [place] DO NOT drive yourself to the hospital.  MAKE SURE YOU:    Understand these instructions.   Will watch your condition.   Will get help right away if you are not doing well or get worse.  Document Released: 12/06/2004 Document Revised: 05/21/2011 Document Reviewed: 08/15/2009  [employer] Patient Information 2014 [employer]

## 2012-10-16 NOTE — Progress Notes (Signed)
  Subjective:    Patient ID: Douglas Short, male    DOB: [birth year], 37 y.o.   MRN: [id number]  HPI  Patient here today to have DMV form filled out- Patient has no medical problems- On no medicines.    Review of Systems  All other systems reviewed and are negative.       Objective:   Physical Exam  Constitutional: He is oriented to person, place, and time. He appears well-developed and well-nourished.  HENT:  Head: Normocephalic.  Right Ear: External ear normal.  Left Ear: External ear normal.  Nose: Nose normal.  Mouth/Throat: Oropharynx is clear and moist.  Eyes: EOM are normal. Pupils are equal, round, and reactive to light.  Neck: Normal range of motion. Neck supple. No thyromegaly present.  Cardiovascular: Normal rate, regular rhythm, normal heart sounds and intact distal pulses.   No murmur heard. Pulmonary/Chest: Effort normal and breath sounds normal. He has no wheezes. He has no rales.  Abdominal: Soft. Bowel sounds are normal.  Musculoskeletal: Normal range of motion.  Neurological: He is alert and oriented to person, place, and time.  Skin: Skin is warm and dry.  Psychiatric: He has a normal mood and affect. His behavior is normal. Judgment and thought content normal.    BP 132/86  Pulse 93  Temp(Src) 97.4 F (36.3 C) (Oral)  Ht 5\' 10"  (1.778 m)  Wt 154 lb (69.854 kg)  BMI 22.1 kg/m2       Assessment & Plan:  1. Other general medical examination for administrative purposes DMV form filled out RTO prn  Mary-Margaret Daphine Deutscher, FNP

## 2014-09-06 ENCOUNTER — Ambulatory Visit: Payer: Self-pay | Admitting: Nurse Practitioner

## 2014-09-06 ENCOUNTER — Encounter: Payer: Self-pay | Admitting: Nurse Practitioner

## 2014-09-06 ENCOUNTER — Encounter (INDEPENDENT_AMBULATORY_CARE_PROVIDER_SITE_OTHER): Payer: Self-pay

## 2014-09-06 VITALS — BP 135/95 | HR 97 | Temp 98.6°F | Ht 70.0 in | Wt 181.6 lb

## 2014-09-06 DIAGNOSIS — Z024 Encounter for examination for driving license: Secondary | ICD-10-CM

## 2014-09-06 NOTE — Progress Notes (Signed)
DMV forms
